# Patient Record
Sex: Female | Born: 1985 | Race: Black or African American | Hispanic: No | Marital: Single | Smoking: Current every day smoker
Health system: Southern US, Community
[De-identification: ages and names within clinical notes are randomized; demographics above are authoritative.]

## PROBLEM LIST (undated history)

## (undated) DIAGNOSIS — I10 Essential (primary) hypertension: Secondary | ICD-10-CM

## (undated) DIAGNOSIS — O24419 Gestational diabetes mellitus in pregnancy, unspecified control: Secondary | ICD-10-CM

---

## 2005-10-27 ENCOUNTER — Ambulatory Visit: Payer: Self-pay | Admitting: Family Medicine

## 2005-10-28 ENCOUNTER — Ambulatory Visit (HOSPITAL_COMMUNITY): Admission: RE | Admit: 2005-10-28 | Discharge: 2005-10-28 | Payer: Self-pay | Admitting: Family Medicine

## 2005-11-03 ENCOUNTER — Ambulatory Visit: Payer: Self-pay | Admitting: Family Medicine

## 2005-11-13 ENCOUNTER — Ambulatory Visit: Payer: Self-pay | Admitting: Gynecology

## 2005-11-27 ENCOUNTER — Ambulatory Visit: Payer: Self-pay | Admitting: Family Medicine

## 2005-12-04 ENCOUNTER — Ambulatory Visit: Payer: Self-pay | Admitting: Gynecology

## 2005-12-05 ENCOUNTER — Ambulatory Visit: Payer: Self-pay | Admitting: Obstetrics & Gynecology

## 2005-12-05 ENCOUNTER — Ambulatory Visit: Payer: Self-pay | Admitting: Family Medicine

## 2005-12-05 ENCOUNTER — Inpatient Hospital Stay (HOSPITAL_COMMUNITY): Admission: AD | Admit: 2005-12-05 | Discharge: 2005-12-13 | Payer: Self-pay | Admitting: Obstetrics & Gynecology

## 2005-12-18 ENCOUNTER — Ambulatory Visit: Payer: Self-pay | Admitting: Family Medicine

## 2005-12-23 ENCOUNTER — Inpatient Hospital Stay (HOSPITAL_COMMUNITY): Admission: AD | Admit: 2005-12-23 | Discharge: 2005-12-23 | Payer: Self-pay | Admitting: Obstetrics and Gynecology

## 2010-07-07 ENCOUNTER — Encounter: Payer: Self-pay | Admitting: *Deleted

## 2012-02-24 ENCOUNTER — Encounter (HOSPITAL_BASED_OUTPATIENT_CLINIC_OR_DEPARTMENT_OTHER): Payer: Self-pay

## 2012-02-24 ENCOUNTER — Emergency Department (HOSPITAL_BASED_OUTPATIENT_CLINIC_OR_DEPARTMENT_OTHER)
Admission: EM | Admit: 2012-02-24 | Discharge: 2012-02-24 | Disposition: A | Discharge status: Home / Self Care | Payer: Self-pay | Attending: Emergency Medicine | Admitting: Emergency Medicine

## 2012-02-24 DIAGNOSIS — N920 Excessive and frequent menstruation with regular cycle: Secondary | ICD-10-CM | POA: Insufficient documentation

## 2012-02-24 DIAGNOSIS — F17200 Nicotine dependence, unspecified, uncomplicated: Secondary | ICD-10-CM | POA: Insufficient documentation

## 2012-02-24 LAB — URINE MICROSCOPIC-ADD ON

## 2012-02-24 LAB — URINALYSIS, ROUTINE W REFLEX MICROSCOPIC
Bilirubin Urine: NEGATIVE
Specific Gravity, Urine: 1.018 (ref 1.005–1.030)
Urobilinogen, UA: 0.2 mg/dL (ref 0.0–1.0)

## 2012-02-24 NOTE — ED Provider Notes (Signed)
History     CSN: [id number]  Arrival date & time 02/24/12  2148   First MD Initiated Contact with Patient 02/24/12 2223      Chief Complaint  Patient presents with  . Vaginal Bleeding    (Consider location/radiation/quality/duration/timing/severity/associated sxs/prior treatment) HPI Pt reports she is approx 10 days late for menses. She woke up this morning with abdominal cramping and back pain. She passed two large clots from her vagina during the day today. She has had minimal continued bleeding since about 2pm. She took a home pregnancy test which is neg. Cramping has improved too.   History reviewed. No pertinent past medical history.  Past Surgical History  Procedure Date  . Cesarean section     No family history on file.  History  Substance Use Topics  . Smoking status: Current Everyday Smoker  . Smokeless tobacco: Not on file  . Alcohol Use: Yes    OB History    Grav Para Term Preterm Abortions TAB SAB Ect Mult Living                  Review of Systems All other systems reviewed and are negative except as noted in HPI.   Allergies  Review of patient's allergies indicates no known allergies.  Home Medications   Current Outpatient Rx  Name Route Sig Dispense Refill  . IBUPROFEN 200 MG PO TABS Oral Take 400 mg by mouth every 6 (six) hours as needed. For sleep    . IRON CR PO Oral Take 1 tablet by mouth daily.      BP 144/84  Pulse 80  Temp 98.1 F (36.7 C) (Oral)  Resp 16  Ht 5\' 4"  (1.626 m)  Wt 290 lb (131.543 kg)  BMI 49.78 kg/m2  SpO2 100%  LMP 01/11/2012  Physical Exam  Nursing note and vitals reviewed. Constitutional: She is oriented to person, place, and time. She appears well-developed and well-nourished.  HENT:  Head: Normocephalic and atraumatic.  Eyes: EOM are normal. Pupils are equal, round, and reactive to light.  Neck: Normal range of motion. Neck supple.  Cardiovascular: Normal rate, normal heart sounds and intact distal  pulses.   Pulmonary/Chest: Effort normal and breath sounds normal.  Abdominal: Bowel sounds are normal. She exhibits no distension. There is no tenderness.  Genitourinary: Cervix exhibits no discharge. There is bleeding around the vagina. No vaginal discharge found.  Musculoskeletal: Normal range of motion. She exhibits no edema and no tenderness.  Neurological: She is alert and oriented to person, place, and time. She has normal strength. No cranial nerve deficit or sensory deficit.  Skin: Skin is warm and dry. No rash noted.  Psychiatric: She has a normal mood and affect.    ED Course  Procedures (including critical care time)  Labs Reviewed  URINALYSIS, ROUTINE W REFLEX MICROSCOPIC - Abnormal; Notable for the following:    APPearance TURBID (*)     Hgb urine dipstick LARGE (*)     Nitrite POSITIVE (*)     Leukocytes, UA SMALL (*)     All other components within normal limits  URINE MICROSCOPIC-ADD ON - Abnormal; Notable for the following:    Squamous Epithelial / LPF FEW (*)     Bacteria, UA MANY (*)     All other components within normal limits  WET PREP, GENITAL - Abnormal; Notable for the following:    Clue Cells Wet Prep HPF POC MODERATE (*)     WBC, Wet Prep HPF POC  RARE (*)     All other components within normal limits  PREGNANCY, URINE  GC/CHLAMYDIA PROBE AMP, GENITAL   No results found.   No diagnosis found.    MDM  Preg neg, suspect menorrhagia due to late menses. Swabs sent but doubt vaginal infection. Advised to return for worsening bleeding or pain.         Chetara Kropp B. Bernette Mayers, MD 02/24/12 2310

## 2012-02-24 NOTE — ED Notes (Signed)
C/o lower back pain and heavy vaginal bleeding with blood clots-started this am-LMP 7/28

## 2012-02-25 LAB — GC/CHLAMYDIA PROBE AMP, GENITAL
Chlamydia, DNA Probe: NEGATIVE
GC Probe Amp, Genital: NEGATIVE

## 2013-09-27 ENCOUNTER — Encounter (HOSPITAL_COMMUNITY): Payer: Self-pay | Admitting: Obstetrics and Gynecology

## 2013-09-28 ENCOUNTER — Other Ambulatory Visit (HOSPITAL_COMMUNITY): Payer: Self-pay | Admitting: Obstetrics and Gynecology

## 2013-09-28 DIAGNOSIS — Z3689 Encounter for other specified antenatal screening: Secondary | ICD-10-CM

## 2013-09-28 DIAGNOSIS — O09299 Supervision of pregnancy with other poor reproductive or obstetric history, unspecified trimester: Secondary | ICD-10-CM

## 2013-10-05 ENCOUNTER — Ambulatory Visit (HOSPITAL_COMMUNITY)
Admission: RE | Admit: 2013-10-05 | Discharge: 2013-10-05 | Disposition: A | Discharge status: Home / Self Care | Payer: Self-pay | Source: Ambulatory Visit | Attending: Obstetrics and Gynecology | Admitting: Obstetrics and Gynecology

## 2013-10-07 ENCOUNTER — Encounter (HOSPITAL_COMMUNITY): Payer: Self-pay

## 2013-10-07 ENCOUNTER — Ambulatory Visit (HOSPITAL_COMMUNITY)
Admission: RE | Admit: 2013-10-07 | Discharge: 2013-10-07 | Disposition: A | Discharge status: Home / Self Care | Payer: Medicaid Other | Source: Ambulatory Visit | Attending: Obstetrics and Gynecology | Admitting: Obstetrics and Gynecology

## 2013-10-07 DIAGNOSIS — Z3689 Encounter for other specified antenatal screening: Secondary | ICD-10-CM

## 2013-10-07 DIAGNOSIS — O09299 Supervision of pregnancy with other poor reproductive or obstetric history, unspecified trimester: Secondary | ICD-10-CM | POA: Insufficient documentation

## 2013-10-10 ENCOUNTER — Other Ambulatory Visit (HOSPITAL_COMMUNITY): Payer: Self-pay | Admitting: Obstetrics and Gynecology

## 2013-10-10 DIAGNOSIS — O358XX0 Maternal care for other (suspected) fetal abnormality and damage, not applicable or unspecified: Secondary | ICD-10-CM

## 2013-10-10 DIAGNOSIS — O99210 Obesity complicating pregnancy, unspecified trimester: Principal | ICD-10-CM

## 2013-10-10 DIAGNOSIS — E669 Obesity, unspecified: Secondary | ICD-10-CM

## 2013-11-18 ENCOUNTER — Ambulatory Visit (HOSPITAL_COMMUNITY): Payer: Medicaid Other | Attending: Obstetrics and Gynecology

## 2013-11-28 ENCOUNTER — Ambulatory Visit (HOSPITAL_COMMUNITY): Payer: Medicaid Other

## 2013-12-05 ENCOUNTER — Ambulatory Visit (HOSPITAL_COMMUNITY): Payer: Medicaid Other

## 2013-12-09 ENCOUNTER — Encounter (HOSPITAL_COMMUNITY): Payer: Self-pay

## 2013-12-09 ENCOUNTER — Ambulatory Visit (HOSPITAL_COMMUNITY)
Admission: RE | Admit: 2013-12-09 | Discharge: 2013-12-09 | Disposition: A | Discharge status: Home / Self Care | Payer: Medicaid Other | Source: Ambulatory Visit | Attending: Obstetrics and Gynecology | Admitting: Obstetrics and Gynecology

## 2013-12-09 ENCOUNTER — Other Ambulatory Visit (HOSPITAL_COMMUNITY): Payer: Self-pay | Admitting: Obstetrics and Gynecology

## 2013-12-09 DIAGNOSIS — O358XX0 Maternal care for other (suspected) fetal abnormality and damage, not applicable or unspecified: Secondary | ICD-10-CM | POA: Diagnosis present

## 2013-12-09 DIAGNOSIS — O99210 Obesity complicating pregnancy, unspecified trimester: Secondary | ICD-10-CM

## 2013-12-09 DIAGNOSIS — E669 Obesity, unspecified: Secondary | ICD-10-CM

## 2013-12-09 DIAGNOSIS — O99810 Abnormal glucose complicating pregnancy: Secondary | ICD-10-CM

## 2013-12-09 DIAGNOSIS — Z3689 Encounter for other specified antenatal screening: Secondary | ICD-10-CM | POA: Insufficient documentation

## 2013-12-09 DIAGNOSIS — O365991 Maternal care for other known or suspected poor fetal growth, unspecified trimester, fetus 1: Secondary | ICD-10-CM

## 2013-12-15 ENCOUNTER — Other Ambulatory Visit (HOSPITAL_COMMUNITY): Payer: Self-pay | Admitting: Obstetrics and Gynecology

## 2013-12-15 ENCOUNTER — Encounter (HOSPITAL_COMMUNITY): Payer: Self-pay

## 2013-12-15 ENCOUNTER — Ambulatory Visit (HOSPITAL_COMMUNITY)
Admission: RE | Admit: 2013-12-15 | Discharge: 2013-12-15 | Disposition: A | Discharge status: Home / Self Care | Payer: Medicaid Other | Source: Ambulatory Visit | Attending: Obstetrics and Gynecology | Admitting: Obstetrics and Gynecology

## 2013-12-15 DIAGNOSIS — O99210 Obesity complicating pregnancy, unspecified trimester: Principal | ICD-10-CM

## 2013-12-15 DIAGNOSIS — O30009 Twin pregnancy, unspecified number of placenta and unspecified number of amniotic sacs, unspecified trimester: Secondary | ICD-10-CM | POA: Diagnosis not present

## 2013-12-15 DIAGNOSIS — O99810 Abnormal glucose complicating pregnancy: Secondary | ICD-10-CM | POA: Insufficient documentation

## 2013-12-15 DIAGNOSIS — O365990 Maternal care for other known or suspected poor fetal growth, unspecified trimester, not applicable or unspecified: Secondary | ICD-10-CM | POA: Insufficient documentation

## 2013-12-15 DIAGNOSIS — Z3689 Encounter for other specified antenatal screening: Secondary | ICD-10-CM | POA: Insufficient documentation

## 2013-12-15 DIAGNOSIS — E669 Obesity, unspecified: Secondary | ICD-10-CM | POA: Insufficient documentation

## 2013-12-15 DIAGNOSIS — O365991 Maternal care for other known or suspected poor fetal growth, unspecified trimester, fetus 1: Secondary | ICD-10-CM

## 2013-12-15 HISTORY — DX: Gestational diabetes mellitus in pregnancy, unspecified control: O24.419

## 2013-12-19 ENCOUNTER — Other Ambulatory Visit: Payer: Self-pay

## 2013-12-23 ENCOUNTER — Ambulatory Visit (HOSPITAL_COMMUNITY): Admission: RE | Admit: 2013-12-23 | Payer: Medicaid Other | Source: Ambulatory Visit

## 2013-12-30 ENCOUNTER — Ambulatory Visit (HOSPITAL_COMMUNITY): Payer: Medicaid Other | Attending: Obstetrics and Gynecology

## 2013-12-30 ENCOUNTER — Ambulatory Visit (HOSPITAL_COMMUNITY): Payer: Medicaid Other

## 2014-01-06 ENCOUNTER — Ambulatory Visit (HOSPITAL_COMMUNITY): Payer: Medicaid Other

## 2014-04-17 ENCOUNTER — Encounter (HOSPITAL_COMMUNITY): Payer: Self-pay

## 2014-08-12 ENCOUNTER — Encounter (HOSPITAL_COMMUNITY): Payer: Self-pay | Admitting: *Deleted

## 2014-12-28 IMAGING — US US OB DETAIL+14 WK
1 series · 12 of 28 positions shown · non-contrast
Comparison: none

[Series 1: us ob detail+14 wk · 0.30mm/px · 12 of 65 slices shown]
[im 3/65]
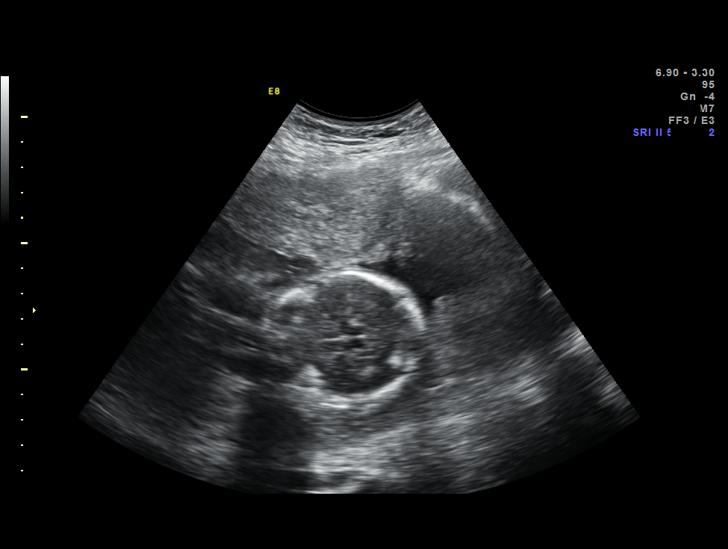
[im 8/65]
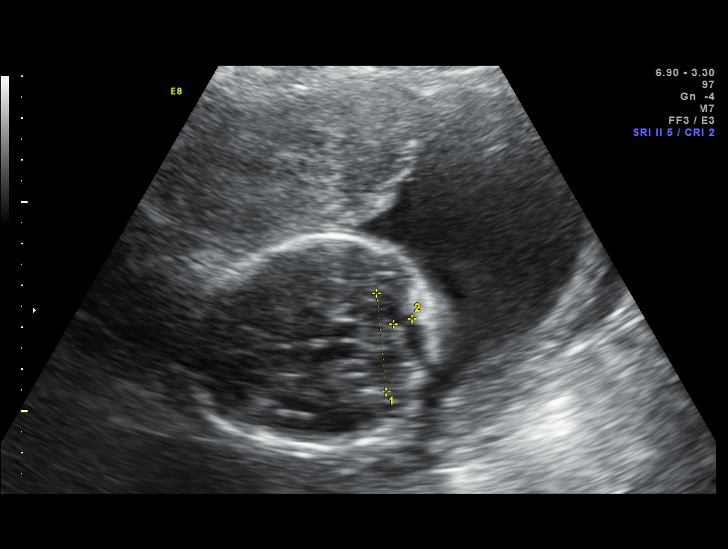
[im 12/65]
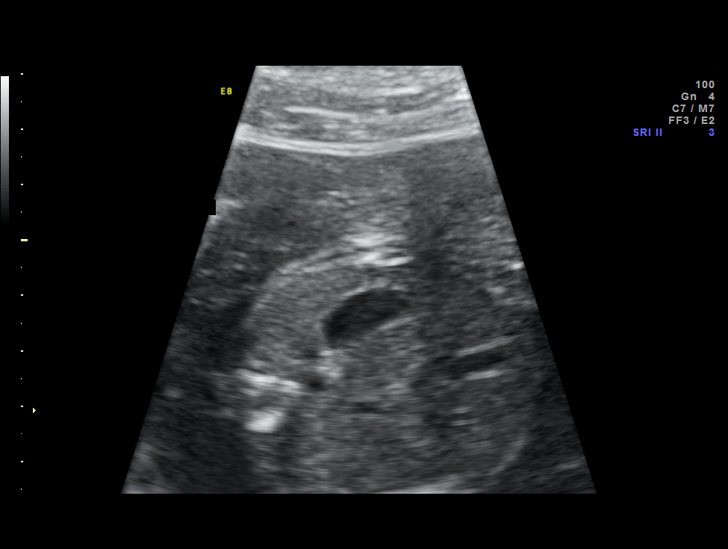
[im 19/65]
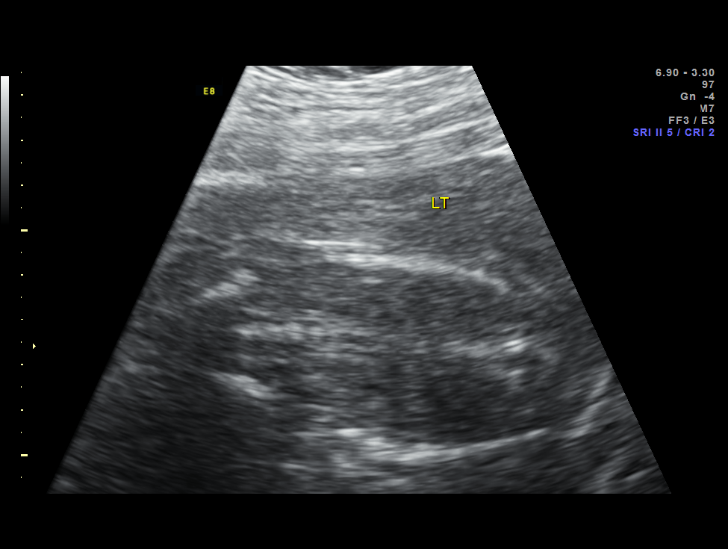
[im 24/65]
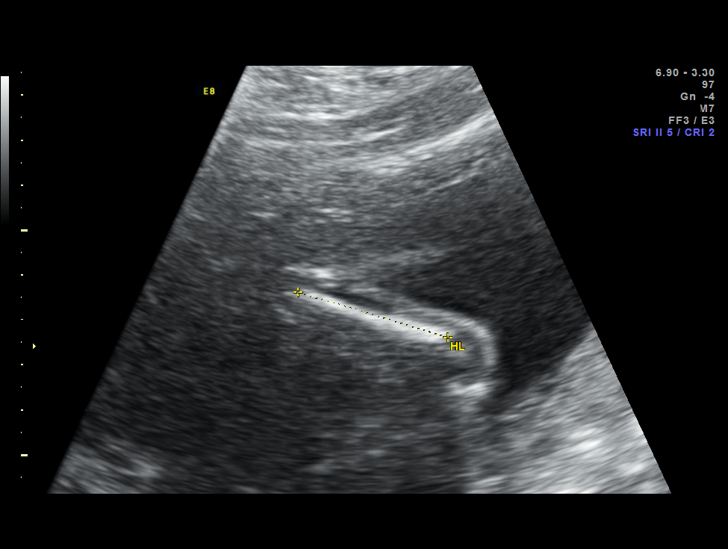
[im 29/65]
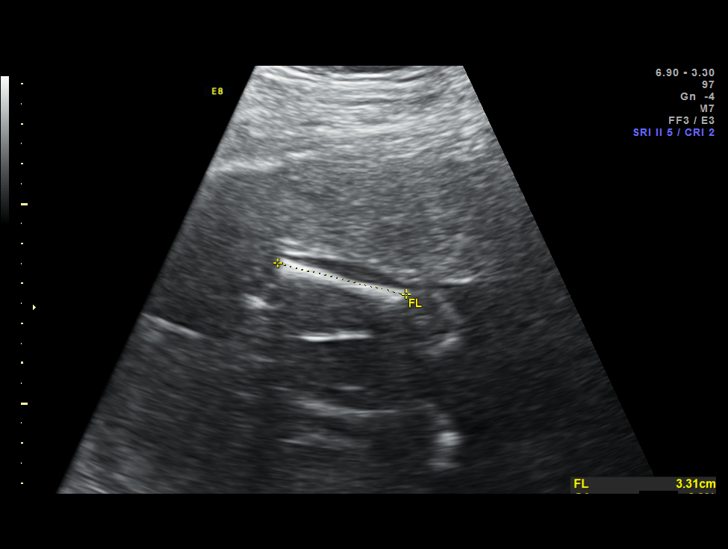
[im 36/65]
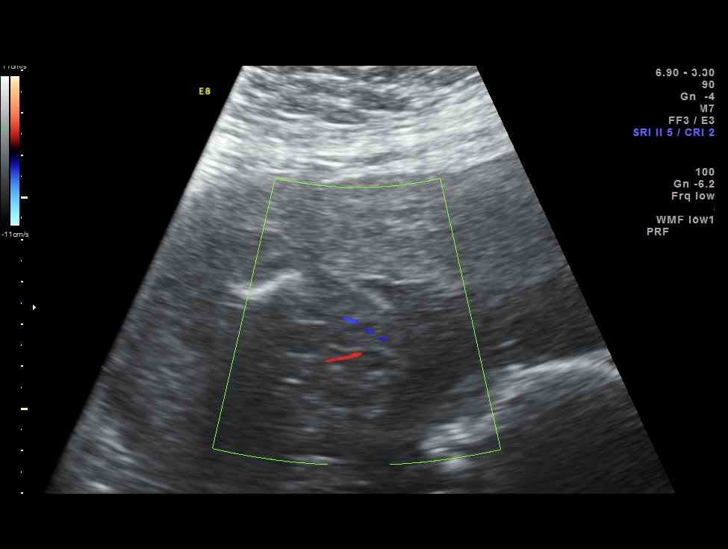
[im 41/65]
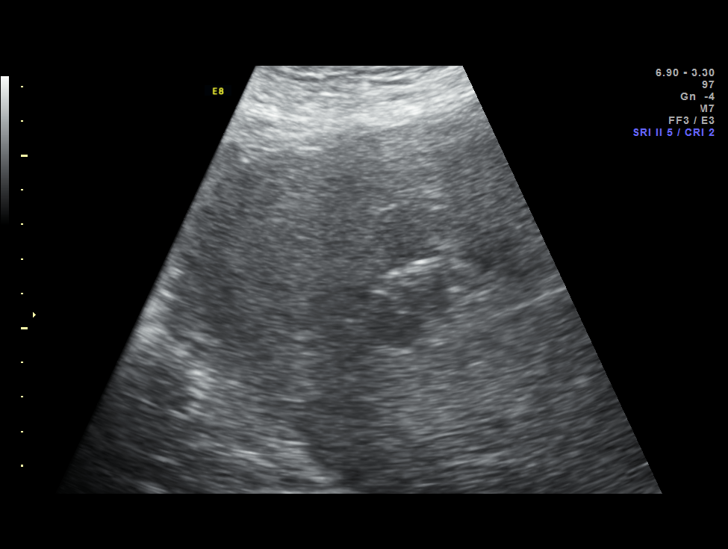
[im 46/65]
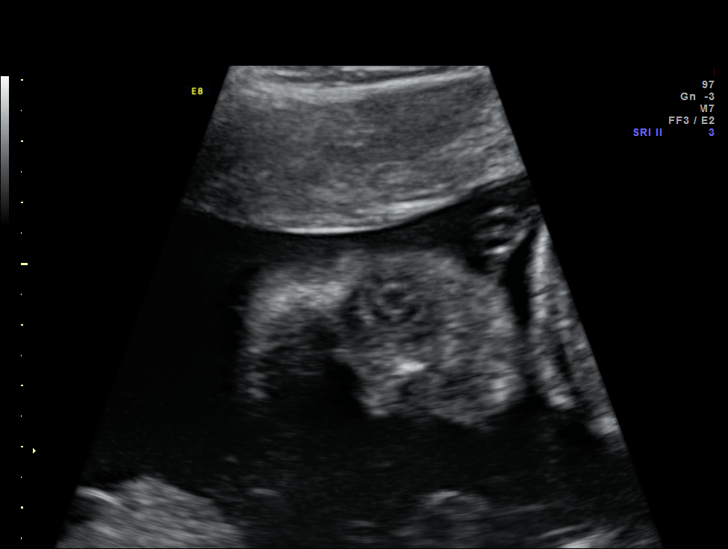
[im 53/65]
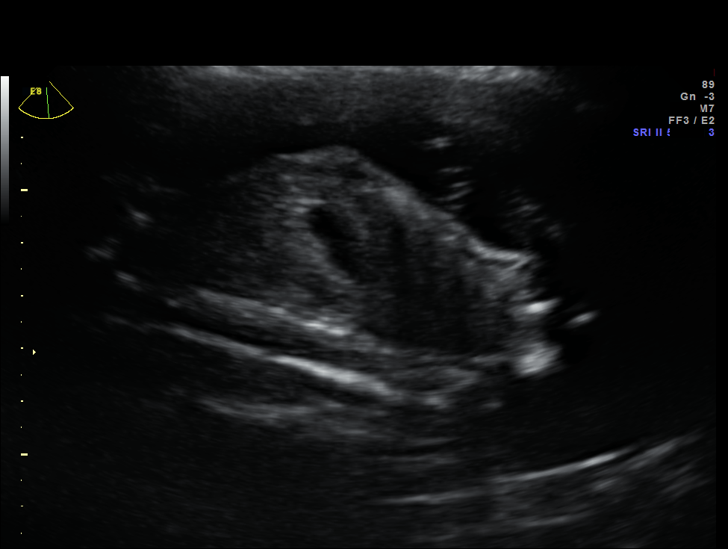
[im 57/65]
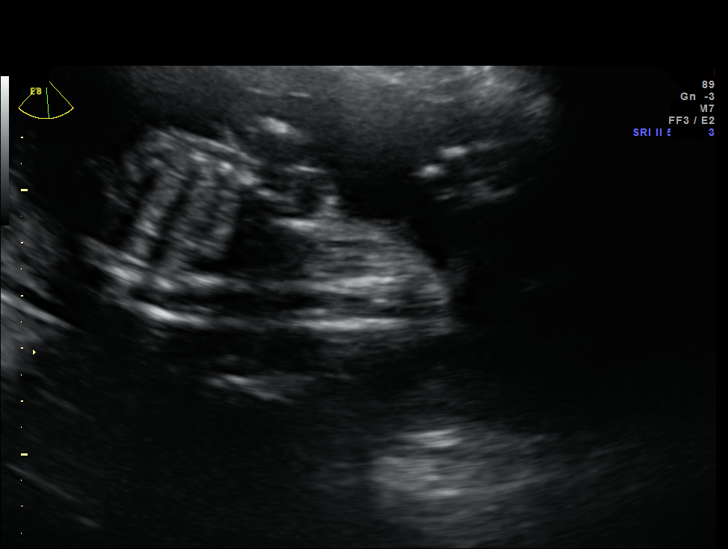
[im 62/65]
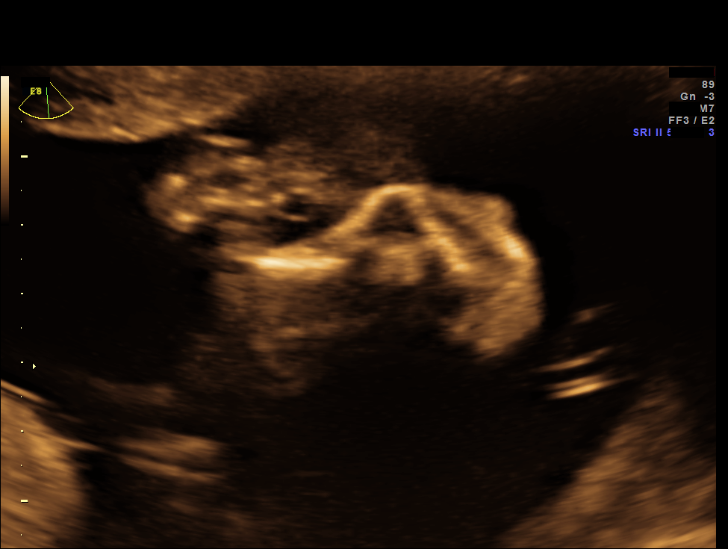

[12 of 28 positions shown; findings below may reference images not displayed]

OBSTETRICS REPORT
                      (Signed Final 10/07/2013 [DATE])

Service(s) Provided

 US OB DETAIL + 14 WK                                  76811.0
Indications

 Detailed fetal anatomic survey
 Maternal morbid obesity
Fetal Evaluation

 Num Of Fetuses:    1
 Fetal Heart Rate:  155                          bpm
 Cardiac Activity:  Observed
 Presentation:      Cephalic
 Placenta:          Fundal, above cervical os
 P. Cord            Not well visualized
 Insertion:

 Amniotic Fluid
 AFI FV:      Subjectively within normal limits
                                             Larg Pckt:     3.9  cm
Biometry

 BPD:     50.7  mm     G. Age:  21w 3d                CI:         72.4   70 - 86
 OFD:       70  mm                                    FL/HC:      17.2   19.2 -

 HC:     197.3  mm     G. Age:  22w 0d       15  %    HC/AC:      1.20   1.05 -

 AC:     163.9  mm     G. Age:  21w 3d       13  %    FL/BPD:     66.9   71 - 87
 FL:      33.9  mm     G. Age:  20w 5d      < 3  %    FL/AC:      20.7   20 - 24
 HUM:     35.6  mm     G. Age:  22w 3d       39  %

 Est. FW:     404  gm    0 lb 14 oz      19  %
Gestational Age

 LMP:           22w 4d        Date:  05/02/13                 EDD:   02/06/14
 U/S Today:     21w 3d                                        EDD:   02/14/14
 Best:          22w 4d     Det. By:  LMP  (05/02/13)          EDD:   02/06/14
Anatomy

 Cranium:          Appears normal         Aortic Arch:      Not well visualized
 Fetal Cavum:      Not well visualized    Ductal Arch:      Not well visualized
 Ventricles:       Not well visualized    Diaphragm:        Not well visualized
 Choroid Plexus:   Not well visualized    Stomach:          Appears normal, left
                                                            sided
 Cerebellum:       Appears normal         Abdomen:          Appears normal
 Posterior Fossa:  Appears normal         Abdominal Wall:   Appears nml (cord
                                                            insert, abd wall)
 Nuchal Fold:      Not applicable (>20    Cord Vessels:     Appears normal (3
                   wks GA)                                  vessel cord)
 Face:             Appears normal         Kidneys:          Appear normal
                   (orbits and profile)
 Lips:             Appears normal         Bladder:          Appears normal
 Heart:            Appears normal         Spine:            Not well visualized
                   (4CH, axis, and
                   situs)
 RVOT:             Appears normal         Lower             Visualized
                                          Extremities:
 LVOT:             Appears normal         Upper             Visualized
                                          Extremities:

 Other:  Technically difficult due to  maternal habitus.
Cervix Uterus Adnexa

 Cervical Length:    5        cm

 Cervix:       Normal appearance by transabdominal scan.
 Uterus:       No abnormality visualized.
 Cul De Sac:   No free fluid seen.
 Left Ovary:    Not visualized.
 Right Ovary:   Not visualized.
Impression

 SIUP at 22+4 weeks
 Normal detailed fetal anatomy; limited views of intracranial
 anatomy; arches and spine
 Normal amniotic fluid volume
 Measurements consistent with LMP dating
Recommendations

 Follow-up ultrasound in 6 weeks to complete anatomy survey
 and reassess growth

 questions or concerns.

## 2023-07-12 ENCOUNTER — Encounter (HOSPITAL_BASED_OUTPATIENT_CLINIC_OR_DEPARTMENT_OTHER): Payer: Self-pay | Admitting: *Deleted

## 2023-07-12 ENCOUNTER — Emergency Department (HOSPITAL_BASED_OUTPATIENT_CLINIC_OR_DEPARTMENT_OTHER): Payer: Medicaid Other

## 2023-07-12 ENCOUNTER — Emergency Department (HOSPITAL_BASED_OUTPATIENT_CLINIC_OR_DEPARTMENT_OTHER)
Admission: EM | Admit: 2023-07-12 | Discharge: 2023-07-12 | Disposition: A | Discharge status: Home / Self Care | Payer: Medicaid Other | Attending: Emergency Medicine | Admitting: Emergency Medicine

## 2023-07-12 ENCOUNTER — Other Ambulatory Visit: Payer: Self-pay

## 2023-07-12 DIAGNOSIS — Z794 Long term (current) use of insulin: Secondary | ICD-10-CM | POA: Insufficient documentation

## 2023-07-12 DIAGNOSIS — D72829 Elevated white blood cell count, unspecified: Secondary | ICD-10-CM | POA: Insufficient documentation

## 2023-07-12 DIAGNOSIS — R112 Nausea with vomiting, unspecified: Secondary | ICD-10-CM | POA: Insufficient documentation

## 2023-07-12 DIAGNOSIS — N3001 Acute cystitis with hematuria: Secondary | ICD-10-CM | POA: Insufficient documentation

## 2023-07-12 DIAGNOSIS — E119 Type 2 diabetes mellitus without complications: Secondary | ICD-10-CM | POA: Diagnosis not present

## 2023-07-12 DIAGNOSIS — E871 Hypo-osmolality and hyponatremia: Secondary | ICD-10-CM | POA: Diagnosis not present

## 2023-07-12 DIAGNOSIS — D649 Anemia, unspecified: Secondary | ICD-10-CM | POA: Insufficient documentation

## 2023-07-12 DIAGNOSIS — R1032 Left lower quadrant pain: Secondary | ICD-10-CM | POA: Diagnosis present

## 2023-07-12 LAB — CBC WITH DIFFERENTIAL/PLATELET
Abs Immature Granulocytes: 0.1 10*3/uL — ABNORMAL HIGH (ref 0.00–0.07)
Basophils Absolute: 0 10*3/uL (ref 0.0–0.1)
Basophils Relative: 0 %
Eosinophils Absolute: 0 10*3/uL (ref 0.0–0.5)
Eosinophils Relative: 0 %
HCT: 33.6 % — ABNORMAL LOW (ref 36.0–46.0)
Hemoglobin: 10.6 g/dL — ABNORMAL LOW (ref 12.0–15.0)
Immature Granulocytes: 1 %
Lymphocytes Relative: 10 %
Lymphs Abs: 1.5 10*3/uL (ref 0.7–4.0)
MCH: 22.7 pg — ABNORMAL LOW (ref 26.0–34.0)
MCHC: 31.5 g/dL (ref 30.0–36.0)
MCV: 72.1 fL — ABNORMAL LOW (ref 80.0–100.0)
Monocytes Absolute: 0.4 10*3/uL (ref 0.1–1.0)
Monocytes Relative: 3 %
Neutro Abs: 12.6 10*3/uL — ABNORMAL HIGH (ref 1.7–7.7)
Neutrophils Relative %: 86 %
Platelets: 382 10*3/uL (ref 150–400)
RBC: 4.66 MIL/uL (ref 3.87–5.11)
RDW: 18.4 % — ABNORMAL HIGH (ref 11.5–15.5)
WBC: 14.7 10*3/uL — ABNORMAL HIGH (ref 4.0–10.5)
nRBC: 0 % (ref 0.0–0.2)

## 2023-07-12 LAB — URINALYSIS, MICROSCOPIC (REFLEX)
RBC / HPF: >50 RBC/hpf (ref 0–5)
WBC, UA: >50 WBC/hpf (ref 0–5)

## 2023-07-12 LAB — URINALYSIS, ROUTINE W REFLEX MICROSCOPIC
Bilirubin Urine: NEGATIVE
Glucose, UA: 100 mg/dL — AB
Ketones, ur: NEGATIVE mg/dL
Nitrite: POSITIVE — AB
Protein, ur: >=300 mg/dL — AB
Specific Gravity, Urine: 1.025 (ref 1.005–1.030)
pH: 6.5 (ref 5.0–8.0)

## 2023-07-12 LAB — BASIC METABOLIC PANEL
Anion gap: 11 (ref 5–15)
BUN: 9 mg/dL (ref 6–20)
CO2: 22 mmol/L (ref 22–32)
Calcium: 8.8 mg/dL — ABNORMAL LOW (ref 8.9–10.3)
Chloride: 98 mmol/L (ref 98–111)
Creatinine, Ser: 0.67 mg/dL (ref 0.44–1.00)
GFR, Estimated: >60 mL/min (ref >60)
Glucose, Bld: 286 mg/dL — ABNORMAL HIGH (ref 70–99)
Potassium: 3.6 mmol/L (ref 3.5–5.1)
Sodium: 131 mmol/L — ABNORMAL LOW (ref 135–145)

## 2023-07-12 LAB — PREGNANCY, URINE: Preg Test, Ur: NEGATIVE

## 2023-07-12 MED ORDER — CEPHALEXIN 500 MG PO CAPS: 500.0000 mg | ORAL_CAPSULE | Freq: Four times a day (QID) | ORAL | 0 refills | Status: DC

## 2023-07-12 MED ORDER — NAPROXEN 500 MG PO TABS
500.0000 mg | ORAL_TABLET | Freq: Two times a day (BID) | ORAL | 0 refills | Status: DC
Start: 2023-07-12 — End: 2024-01-05

## 2023-07-12 MED ORDER — SODIUM CHLORIDE 0.9 % IV SOLN
1.0000 g | Freq: Once | INTRAVENOUS | Status: AC
  Administered 2023-07-12: 1 g via INTRAVENOUS
  Filled 2023-07-12: qty 10

## 2023-07-12 MED ORDER — ONDANSETRON 4 MG PO TBDP
4.0000 mg | ORAL_TABLET | Freq: Once | ORAL | Status: AC
  Administered 2023-07-12: 4 mg via ORAL
  Filled 2023-07-12: qty 1

## 2023-07-12 MED ORDER — KETOROLAC TROMETHAMINE 30 MG/ML IJ SOLN
30.0000 mg | Freq: Once | INTRAMUSCULAR | Status: AC
  Administered 2023-07-12: 30 mg via INTRAVENOUS
  Filled 2023-07-12: qty 1

## 2023-07-12 MED ORDER — HALOPERIDOL LACTATE 5 MG/ML IJ SOLN
5.0000 mg | Freq: Once | INTRAMUSCULAR | Status: AC
  Administered 2023-07-12: 5 mg via INTRAVENOUS
  Filled 2023-07-12: qty 1

## 2023-07-12 MED ORDER — ONDANSETRON 8 MG PO TBDP: ORAL_TABLET | ORAL | 0 refills | Status: DC

## 2023-07-12 NOTE — ED Provider Notes (Signed)
[place] [facility] Provider Note   CSN: [id number] Arrival date & time: 07/12/23  0028     History  Chief Complaint  Patient presents with   Abdominal Pain    Heather Deleon is a 38 y.o. female.  The history is provided by the patient.  Abdominal Pain Pain location:  Suprapubic Pain quality: pressure   Pain radiates to:  LLQ Pain severity:  Moderate Onset quality:  Gradual Duration:  3 hours Timing:  Constant Progression:  Unchanged Chronicity:  New Context: not trauma   Relieved by:  Nothing Worsened by:  Nothing Ineffective treatments:  None tried Associated symptoms: dysuria, nausea and vomiting   Associated symptoms: no chest pain, no fever and no hematuria   Risk factors: not pregnant        Home Medications Prior to Admission medications   Medication Sig Start Date End Date Taking? Authorizing Provider  cephALEXin (KEFLEX) 500 MG capsule Take 1 capsule (500 mg total) by mouth 4 (four) times daily. 07/12/23  Yes Darrian Goodwill, MD  naproxen (NAPROSYN) 500 MG tablet Take 1 tablet (500 mg total) by mouth 2 (two) times daily. 07/12/23  Yes Ether Goebel, MD  ondansetron (ZOFRAN-ODT) 8 MG disintegrating tablet 8mg  ODT q8 hours prn nausea 07/12/23  Yes Franceska Strahm, MD  amoxicillin (AMOXIL) 500 MG capsule Take 500 mg by mouth 2 (two) times daily with a meal.    [provider]  ibuprofen (ADVIL,MOTRIN) 200 MG tablet Take 400 mg by mouth every 6 (six) hours as needed. For sleep    [provider]  insulin NPH Human (HUMULIN N,NOVOLIN N) 100 UNIT/ML injection Inject 20 Units into the skin at bedtime.    [provider]  IRON CR PO Take 1 tablet by mouth daily.    [provider]      Allergies    Percocet [oxycodone-acetaminophen]    Review of Systems   Review of Systems  Constitutional:  Negative for fever.  HENT:  Negative for facial swelling.   Respiratory:  Negative for wheezing and  stridor.   Cardiovascular:  Negative for chest pain.  Gastrointestinal:  Positive for abdominal pain, nausea and vomiting.  Genitourinary:  Positive for dysuria. Negative for flank pain and hematuria.  All other systems reviewed and are negative.   Physical Exam Updated Vital Signs BP (!) 145/79 (BP Location: Left Arm)   Pulse 67   Temp 98.3 F (36.8 C)   Resp 18   LMP 07/05/2023   SpO2 95%  Physical Exam Vitals and nursing note reviewed.  Constitutional:      General: She is not in acute distress.    Appearance: She is well-developed.  HENT:     Head: Normocephalic and atraumatic.  Eyes:     Pupils: Pupils are equal, round, and reactive to light.  Cardiovascular:     Rate and Rhythm: Normal rate and regular rhythm.     Pulses: Normal pulses.     Heart sounds: Normal heart sounds.  Pulmonary:     Effort: Pulmonary effort is normal. No respiratory distress.     Breath sounds: Normal breath sounds.  Abdominal:     General: Bowel sounds are normal. There is no distension.     Palpations: Abdomen is soft.     Tenderness: There is no abdominal tenderness. There is no guarding or rebound.  Genitourinary:    Vagina: No vaginal discharge.  Musculoskeletal:        General:  Normal range of motion.     Cervical back: Neck supple.  Skin:    General: Skin is dry.     Capillary Refill: Capillary refill takes less than 2 seconds.     Findings: No erythema or rash.  Neurological:     General: No focal deficit present.     Deep Tendon Reflexes: Reflexes normal.  Psychiatric:        Mood and Affect: Mood normal.     ED Results / Procedures / Treatments   Labs (all labs ordered are listed, but only abnormal results are displayed) Results for orders placed or performed during the hospital encounter of 07/12/23  Urinalysis, Routine w reflex microscopic -Urine, Clean Catch   Collection Time: 07/12/23 12:42 AM  Result Value Ref Range   Color, Urine YELLOW YELLOW   APPearance  TURBID (A) CLEAR   Specific Gravity, Urine 1.025 1.005 - 1.030   pH 6.5 5.0 - 8.0   Glucose, UA 100 (A) NEGATIVE mg/dL   Hgb urine dipstick LARGE (A) NEGATIVE   Bilirubin Urine NEGATIVE NEGATIVE   Ketones, ur NEGATIVE NEGATIVE mg/dL   Protein, ur >=098 (A) NEGATIVE mg/dL   Nitrite POSITIVE (A) NEGATIVE   Leukocytes,Ua SMALL (A) NEGATIVE  Urinalysis, Microscopic (reflex)   Collection Time: 07/12/23 12:42 AM  Result Value Ref Range   RBC / HPF >50 0 - 5 RBC/hpf   WBC, UA >50 0 - 5 WBC/hpf   Bacteria, UA MANY (A) NONE SEEN   Squamous Epithelial / HPF 0-5 0 - 5 /HPF   WBC Clumps PRESENT    Mucus PRESENT   Pregnancy, urine   Collection Time: 07/12/23 12:43 AM  Result Value Ref Range   Preg Test, Ur NEGATIVE NEGATIVE  CBC with Differential   Collection Time: 07/12/23  1:56 AM  Result Value Ref Range   WBC 14.7 (H) 4.0 - 10.5 K/uL   RBC 4.66 3.87 - 5.11 MIL/uL   Hemoglobin 10.6 (L) 12.0 - 15.0 g/dL   HCT 11.9 (L) 14.7 - 82.9 %   MCV 72.1 (L) 80.0 - 100.0 fL   MCH 22.7 (L) 26.0 - 34.0 pg   MCHC 31.5 30.0 - 36.0 g/dL   RDW 56.2 (H) 13.0 - 86.5 %   Platelets 382 150 - 400 K/uL   nRBC 0.0 0.0 - 0.2 %   Neutrophils Relative % 86 %   Neutro Abs 12.6 (H) 1.7 - 7.7 K/uL   Lymphocytes Relative 10 %   Lymphs Abs 1.5 0.7 - 4.0 K/uL   Monocytes Relative 3 %   Monocytes Absolute 0.4 0.1 - 1.0 K/uL   Eosinophils Relative 0 %   Eosinophils Absolute 0.0 0.0 - 0.5 K/uL   Basophils Relative 0 %   Basophils Absolute 0.0 0.0 - 0.1 K/uL   Immature Granulocytes 1 %   Abs Immature Granulocytes 0.10 (H) 0.00 - 0.07 K/uL  Basic metabolic panel   Collection Time: 07/12/23  1:56 AM  Result Value Ref Range   Sodium 131 (L) 135 - 145 mmol/L   Potassium 3.6 3.5 - 5.1 mmol/L   Chloride 98 98 - 111 mmol/L   CO2 22 22 - 32 mmol/L   Glucose, Bld 286 (H) 70 - 99 mg/dL   BUN 9 6 - 20 mg/dL   Creatinine, Ser 7.84 0.44 - 1.00 mg/dL   Calcium 8.8 (L) 8.9 - 10.3 mg/dL   GFR, Estimated >69 >62 mL/min    Anion gap 11 5 - 15  CT Renal Stone Study Result Date: 07/12/2023 CLINICAL DATA:  Abdominal/flank pain, stone suspected EXAM: CT ABDOMEN AND PELVIS WITHOUT CONTRAST TECHNIQUE: Multidetector CT imaging of the abdomen and pelvis was performed following the standard protocol without IV contrast. RADIATION DOSE REDUCTION: This exam was performed according to the departmental dose-optimization program which includes automated exposure control, adjustment of the mA and/or kV according to patient size and/or use of iterative reconstruction technique. COMPARISON:  CT stone 08/14/2015 report without imaging FINDINGS: Lower chest: No acute abnormality. Hepatobiliary: Liver is enlarged measuring up to 25 cm. No focal liver abnormality. No gallstones, gallbladder wall thickening, or pericholecystic fluid. No biliary dilatation. Pancreas: No focal lesion. Normal pancreatic contour. No surrounding inflammatory changes. No main pancreatic ductal dilatation. Spleen: Normal in size without focal abnormality. Adrenals/Urinary Tract: No adrenal nodule bilaterally. Nonspecific slight haziness of the left collecting system. No nephrolithiasis and no hydronephrosis. No definite contour-deforming renal mass. No ureterolithiasis or hydroureter. The urinary bladder is unremarkable. Stomach/Bowel: Stomach is within normal limits. No evidence of bowel wall thickening or dilatation. Appendix appears normal. Vascular/Lymphatic: No abdominal aorta or iliac aneurysm. No abdominal, pelvic, or inguinal lymphadenopathy. Reproductive: Lobulated uterine contour suggestive of uterine fibroid (602:94). Bilateral adnexal regions are unremarkable. Other: No intraperitoneal free fluid. No intraperitoneal free gas. No organized fluid collection. Musculoskeletal: Diastasis rectus. Mild subcutaneus soft tissue edema of the anterior abdominal wall may be due to prior surgery. No abdominal wall hernia. No suspicious lytic or blastic osseous lesions. No  acute displaced fracture. IMPRESSION: 1. Nonspecific slight haziness of the left connecting system with limited evaluation on this noncontrast study. Correlate with urinalysis. 2. Uterine fibroid. 3. Hepatomegaly. Electronically Signed   By: Tish Frederickson M.D.   On: 07/12/2023 01:51    EKG None  Radiology CT Renal Stone Study Result Date: 07/12/2023 CLINICAL DATA:  Abdominal/flank pain, stone suspected EXAM: CT ABDOMEN AND PELVIS WITHOUT CONTRAST TECHNIQUE: Multidetector CT imaging of the abdomen and pelvis was performed following the standard protocol without IV contrast. RADIATION DOSE REDUCTION: This exam was performed according to the departmental dose-optimization program which includes automated exposure control, adjustment of the mA and/or kV according to patient size and/or use of iterative reconstruction technique. COMPARISON:  CT stone 08/14/2015 report without imaging FINDINGS: Lower chest: No acute abnormality. Hepatobiliary: Liver is enlarged measuring up to 25 cm. No focal liver abnormality. No gallstones, gallbladder wall thickening, or pericholecystic fluid. No biliary dilatation. Pancreas: No focal lesion. Normal pancreatic contour. No surrounding inflammatory changes. No main pancreatic ductal dilatation. Spleen: Normal in size without focal abnormality. Adrenals/Urinary Tract: No adrenal nodule bilaterally. Nonspecific slight haziness of the left collecting system. No nephrolithiasis and no hydronephrosis. No definite contour-deforming renal mass. No ureterolithiasis or hydroureter. The urinary bladder is unremarkable. Stomach/Bowel: Stomach is within normal limits. No evidence of bowel wall thickening or dilatation. Appendix appears normal. Vascular/Lymphatic: No abdominal aorta or iliac aneurysm. No abdominal, pelvic, or inguinal lymphadenopathy. Reproductive: Lobulated uterine contour suggestive of uterine fibroid (602:94). Bilateral adnexal regions are unremarkable. Other: No  intraperitoneal free fluid. No intraperitoneal free gas. No organized fluid collection. Musculoskeletal: Diastasis rectus. Mild subcutaneus soft tissue edema of the anterior abdominal wall may be due to prior surgery. No abdominal wall hernia. No suspicious lytic or blastic osseous lesions. No acute displaced fracture. IMPRESSION: 1. Nonspecific slight haziness of the left connecting system with limited evaluation on this noncontrast study. Correlate with urinalysis. 2. Uterine fibroid. 3. Hepatomegaly. Electronically Signed   By: Normajean Glasgow.D.  On: 07/12/2023 01:51    Procedures Procedures    Medications Ordered in ED Medications  ondansetron (ZOFRAN-ODT) disintegrating tablet 4 mg (4 mg Oral Given 07/12/23 0148)  ketorolac (TORADOL) 30 MG/ML injection 30 mg (30 mg Intravenous Given 07/12/23 0207)  haloperidol lactate (HALDOL) injection 5 mg (5 mg Intravenous Given 07/12/23 0207)  cefTRIAXone (ROCEPHIN) 1 g in sodium chloride 0.9 % 100 mL IVPB (0 g Intravenous Stopped 07/12/23 0321)    ED Course/ Medical Decision Making/ A&P                                 Medical Decision Making Patient with suprapubic pain that started radiated to LLQ with now emesis.    Amount and/or Complexity of Data Reviewed External Data Reviewed: notes.    Details: Previous notes reviewed  Labs: ordered.    Details: Pregnancy is negative urine is consistent with UTI. White count elevated 14.7, low hemoglobin 10.6, normal platelets.  Sodium slight low 131, normal potassium 3.6 normal creatinine  Radiology: ordered and independent interpretation performed.    Details: No stones by me on CT  Risk Prescription drug management. Risk Details: Well appearing. Emesis stopped in the ED.  Patient reports marked improvement in symptoms.  Will start antibiotics for UTI and nause medication.   Stable for discharge with close follow up.  Strict return     Final Clinical Impression(s) / ED Diagnoses Final  diagnoses:  Acute cystitis with hematuria  Type 2 diabetes mellitus without complication, without long-term current use of insulin (HCC)   I have reviewed the triage vital signs and the nursing notes. Pertinent labs & imaging results that were available during my care of the patient were reviewed by me and considered in my medical decision making (see chart for details). After history, exam, and medical workup I feel the patient has been appropriately medically screened and is safe for discharge home. Pertinent diagnoses were discussed with the patient. Patient was given return precautions.  Rx / DC Orders ED Discharge Orders          Ordered    cephALEXin (KEFLEX) 500 MG capsule  4 times daily        07/12/23 0240    naproxen (NAPROSYN) 500 MG tablet  2 times daily        07/12/23 0240    ondansetron (ZOFRAN-ODT) 8 MG disintegrating tablet        07/12/23 0240              Herron Fero, MD 07/12/23 1610

## 2023-07-12 NOTE — ED Triage Notes (Signed)
Pt is here for evaluation of left sided abdominal pain with radiation into left flank and pressure when urinating and frequent urination.  Pt reports that this began a few hours ago and has been associated with nausea and vomiting x4

## 2024-01-04 ENCOUNTER — Encounter (HOSPITAL_COMMUNITY): Payer: Self-pay

## 2024-01-04 ENCOUNTER — Emergency Department (HOSPITAL_COMMUNITY)

## 2024-01-04 ENCOUNTER — Emergency Department (HOSPITAL_COMMUNITY)
Admission: EM | Admit: 2024-01-04 | Discharge: 2024-01-05 | Disposition: A | Discharge status: Home / Self Care | Attending: Emergency Medicine | Admitting: Emergency Medicine

## 2024-01-04 DIAGNOSIS — Z79899 Other long term (current) drug therapy: Secondary | ICD-10-CM | POA: Insufficient documentation

## 2024-01-04 DIAGNOSIS — B3731 Acute candidiasis of vulva and vagina: Secondary | ICD-10-CM | POA: Diagnosis not present

## 2024-01-04 DIAGNOSIS — I10 Essential (primary) hypertension: Secondary | ICD-10-CM | POA: Insufficient documentation

## 2024-01-04 DIAGNOSIS — E876 Hypokalemia: Secondary | ICD-10-CM | POA: Insufficient documentation

## 2024-01-04 DIAGNOSIS — D649 Anemia, unspecified: Secondary | ICD-10-CM | POA: Insufficient documentation

## 2024-01-04 DIAGNOSIS — E119 Type 2 diabetes mellitus without complications: Secondary | ICD-10-CM | POA: Insufficient documentation

## 2024-01-04 DIAGNOSIS — N12 Tubulo-interstitial nephritis, not specified as acute or chronic: Secondary | ICD-10-CM | POA: Diagnosis not present

## 2024-01-04 DIAGNOSIS — R1032 Left lower quadrant pain: Secondary | ICD-10-CM | POA: Diagnosis present

## 2024-01-04 DIAGNOSIS — Z91148 Patient's other noncompliance with medication regimen for other reason: Secondary | ICD-10-CM | POA: Insufficient documentation

## 2024-01-04 HISTORY — DX: Essential (primary) hypertension: I10

## 2024-01-04 LAB — CBC WITH DIFFERENTIAL/PLATELET
Abs Immature Granulocytes: 0.07 K/uL (ref 0.00–0.07)
Basophils Absolute: 0 K/uL (ref 0.0–0.1)
Basophils Relative: 0 %
Eosinophils Absolute: 0.1 K/uL (ref 0.0–0.5)
Eosinophils Relative: 1 %
HCT: 38.7 % (ref 36.0–46.0)
Hemoglobin: 11.6 g/dL — ABNORMAL LOW (ref 12.0–15.0)
Immature Granulocytes: 1 %
Lymphocytes Relative: 19 %
Lymphs Abs: 2.2 K/uL (ref 0.7–4.0)
MCH: 23.5 pg — ABNORMAL LOW (ref 26.0–34.0)
MCHC: 30 g/dL (ref 30.0–36.0)
MCV: 78.5 fL — ABNORMAL LOW (ref 80.0–100.0)
Monocytes Absolute: 0.5 K/uL (ref 0.1–1.0)
Monocytes Relative: 4 %
Neutro Abs: 8.9 K/uL — ABNORMAL HIGH (ref 1.7–7.7)
Neutrophils Relative %: 75 %
Platelets: 405 K/uL — ABNORMAL HIGH (ref 150–400)
RBC: 4.93 MIL/uL (ref 3.87–5.11)
RDW: 16 % — ABNORMAL HIGH (ref 11.5–15.5)
WBC: 11.8 K/uL — ABNORMAL HIGH (ref 4.0–10.5)
nRBC: 0 % (ref 0.0–0.2)

## 2024-01-04 LAB — COMPREHENSIVE METABOLIC PANEL WITH GFR
ALT: 13 U/L (ref 0–44)
AST: 17 U/L (ref 15–41)
Albumin: 3.3 g/dL — ABNORMAL LOW (ref 3.5–5.0)
Alkaline Phosphatase: 70 U/L (ref 38–126)
Anion gap: 8 (ref 5–15)
BUN: 7 mg/dL (ref 6–20)
CO2: 27 mmol/L (ref 22–32)
Calcium: 8.8 mg/dL — ABNORMAL LOW (ref 8.9–10.3)
Chloride: 102 mmol/L (ref 98–111)
Creatinine, Ser: 0.66 mg/dL (ref 0.44–1.00)
GFR, Estimated: >60 mL/min (ref >60)
Glucose, Bld: 211 mg/dL — ABNORMAL HIGH (ref 70–99)
Potassium: 3.3 mmol/L — ABNORMAL LOW (ref 3.5–5.1)
Sodium: 137 mmol/L (ref 135–145)
Total Bilirubin: 0.5 mg/dL (ref 0.0–1.2)
Total Protein: 7.5 g/dL (ref 6.5–8.1)

## 2024-01-04 LAB — URINALYSIS, ROUTINE W REFLEX MICROSCOPIC
Bilirubin Urine: NEGATIVE
Glucose, UA: 50 mg/dL — AB
Ketones, ur: NEGATIVE mg/dL
Nitrite: POSITIVE — AB
Protein, ur: >=300 mg/dL — AB
Specific Gravity, Urine: 1.02 (ref 1.005–1.030)
WBC, UA: >50 WBC/hpf (ref 0–5)
pH: 6 (ref 5.0–8.0)

## 2024-01-04 NOTE — ED Notes (Signed)
 Post void visual-132 ml

## 2024-01-04 NOTE — ED Triage Notes (Signed)
 Patient arrived with complaints of left sided abdominal pain, reporting dark urine with an odor and feels likes she is not fully emptying her bladder. Given 650 mg tylenol with EMS

## 2024-01-05 ENCOUNTER — Encounter (HOSPITAL_COMMUNITY): Payer: Self-pay

## 2024-01-05 ENCOUNTER — Emergency Department (HOSPITAL_COMMUNITY)

## 2024-01-05 LAB — PREGNANCY, URINE: Preg Test, Ur: NEGATIVE

## 2024-01-05 MED ORDER — POTASSIUM CHLORIDE CRYS ER 20 MEQ PO TBCR
40.0000 meq | EXTENDED_RELEASE_TABLET | Freq: Once | ORAL | Status: AC
Start: 2024-01-05 — End: 2024-01-05
  Administered 2024-01-05: 40 meq via ORAL
  Filled 2024-01-05: qty 2

## 2024-01-05 MED ORDER — AMLODIPINE BESYLATE 10 MG PO TABS: 10.0000 mg | ORAL_TABLET | Freq: Every day | ORAL | 2 refills | Status: AC

## 2024-01-05 MED ORDER — SODIUM CHLORIDE 0.9 % IV SOLN
2.0000 g | Freq: Once | INTRAVENOUS | Status: AC
  Administered 2024-01-05: 2 g via INTRAVENOUS
  Filled 2024-01-05: qty 20

## 2024-01-05 MED ORDER — MORPHINE SULFATE (PF) 4 MG/ML IV SOLN
4.0000 mg | Freq: Once | INTRAVENOUS | Status: AC
  Administered 2024-01-05: 4 mg via INTRAVENOUS
  Filled 2024-01-05: qty 1

## 2024-01-05 MED ORDER — CEFPODOXIME PROXETIL 200 MG PO TABS: 200.0000 mg | ORAL_TABLET | Freq: Two times a day (BID) | ORAL | 0 refills | Status: DC

## 2024-01-05 MED ORDER — FLUCONAZOLE 150 MG PO TABS: 150.0000 mg | ORAL_TABLET | Freq: Every day | ORAL | 0 refills | Status: AC

## 2024-01-05 MED ORDER — ONDANSETRON HCL 4 MG/2ML IJ SOLN
4.0000 mg | Freq: Once | INTRAMUSCULAR | Status: AC
  Administered 2024-01-05: 4 mg via INTRAVENOUS
  Filled 2024-01-05: qty 2

## 2024-01-05 MED ORDER — AMLODIPINE BESYLATE 5 MG PO TABS
5.0000 mg | ORAL_TABLET | Freq: Once | ORAL | Status: DC
  Filled 2024-01-05: qty 1

## 2024-01-05 MED ORDER — AMLODIPINE BESYLATE 5 MG PO TABS
10.0000 mg | ORAL_TABLET | Freq: Once | ORAL | Status: AC
  Administered 2024-01-05: 10 mg via ORAL
  Filled 2024-01-05: qty 2

## 2024-01-05 MED ORDER — ONDANSETRON 4 MG PO TBDP: 4.0000 mg | ORAL_TABLET | Freq: Three times a day (TID) | ORAL | 0 refills | Status: DC | PRN

## 2024-01-05 MED ORDER — IOHEXOL 300 MG/ML  SOLN
100.0000 mL | Freq: Once | INTRAMUSCULAR | Status: AC | PRN
  Administered 2024-01-05: 100 mL via INTRAVENOUS

## 2024-01-05 NOTE — Discharge Instructions (Addendum)
 You were seen in the ER today for evaluation of your symptoms.  About that you are feeling better.  I have included more information in the discharge report for you to review on your diagnosis.  You will need to follow-up with your primary care provider to have your medications refilled as well as to recheck your potassium as it was low today.  It is extremely important that you remain compliant to your medications and take them as prescribed.  I have refilled a medication called amlodipine  which you can take daily for your blood pressure.  Additionally, you will need to be on an antibiotic for your urinary tract infection.  You need to take this twice a day for the next 10 days.  Please make sure that you are compliant with the medication and take as directed.  I have prescribed you Diflucan  that you will take today and in 72 hours take the second dose.  Additionally, have sent you in some Zofran  which can take as needed for nausea.  Please make sure that you are staying well-hydrated plenty of fluids, any water.  If you have any concerns for new or worsening symptoms, please return to your nearest emerged part for reevaluation.  Contact a doctor if: You do not feel better after 2 days. Your symptoms get worse. You have a fever or chills. You cannot take your medicine. Get help right away if: You vomit each time that you eat or drink. You have very bad pain in your back or side. You are very weak, or you faint.

## 2024-01-05 NOTE — ED Notes (Signed)
 Patient d.c with home care instructions. IV discontinued. Provided patient with a bus pass.

## 2024-01-05 NOTE — ED Provider Notes (Signed)
 [place] EMERGENCY DEPARTMENT AT [facility] Provider Note   CSN: [id number] Arrival date & time: 01/04/24  2109     Patient presents with: No chief complaint on file.   Heather Deleon is a 38 y.o. female with h/o HTN, DM presents to the ER today for evaluation of left lower quadrant pain for the past 3 days.  Described as achy in nature.  Does radiate some to the left lower back.  Is having some dysuria with urgency and frequency but no hematuria.  Is having some nausea with some vomiting tonight but no coffee-ground emesis or hematemesis.  Denies any vaginal bleeding or abnormal acute vaginal discharge.  Denies any fevers or diarrhea.  Reports that she does feel possibly with her last bowel movement being today but was hard and small.  Still passing gas.  Denies any chest pain, shortness of breath, headache, blurry vision.  Patient reports that she has not had her diabetes or blood pressure medication in months.  Does not know what her call but reports that she does have refills available at the pharmacy.  No known drug allergies.  Does smoke 1 pack/day.  Denies any EtOH or illicit drug use. Sees [facility] for PCP.   HPI     Prior to Admission medications   Medication Sig Start Date End Date Taking? Authorizing Provider  amLODipine  (NORVASC ) 10 MG tablet Take 1 tablet (10 mg total) by mouth daily. 01/05/24  Yes Bernis Ernst, PA-C  cefpodoxime  (VANTIN ) 200 MG tablet Take 1 tablet (200 mg total) by mouth 2 (two) times daily for 10 days. 01/05/24 01/15/24 Yes Bernis Ernst, PA-C  fluconazole  (DIFLUCAN ) 150 MG tablet Take 1 tablet (150 mg total) by mouth daily for 2 doses. Take 1 pill today. Take the other dose 72 hours later. 01/05/24 01/07/24 Yes Bernis Ernst, PA-C  ondansetron  (ZOFRAN -ODT) 4 MG disintegrating tablet Take 1 tablet (4 mg total) by mouth every 8 (eight) hours as needed for nausea or vomiting. 01/05/24  Yes Bernis Ernst, PA-C    Allergies: Percocet  [oxycodone-acetaminophen]    Review of Systems  Constitutional:  Negative for chills and fever.  Respiratory:  Negative for shortness of breath.   Cardiovascular:  Negative for chest pain.  Gastrointestinal:  Positive for abdominal pain, constipation, nausea and vomiting. Negative for diarrhea.       Denies any fecal incontinence  Genitourinary:  Positive for dysuria, frequency and urgency. Negative for hematuria, vaginal bleeding, vaginal discharge and vaginal pain.       Denies any urinary incontinence or urinary retention.  Musculoskeletal:  Positive for back pain.       Denies any saddle anesthesia  Neurological:  Negative for weakness and numbness.    Updated Vital Signs BP (!) 154/84 (BP Location: Left Arm)   Pulse 82   Temp 98.4 F (36.9 C) (Oral)   Resp 18   SpO2 99%   Physical Exam Vitals and nursing note reviewed.  Constitutional:      General: She is not in acute distress.    Appearance: She is not ill-appearing or toxic-appearing.  HENT:     Mouth/Throat:     Mouth: Mucous membranes are moist.  Eyes:     General: No scleral icterus. Cardiovascular:     Rate and Rhythm: Normal rate.  Pulmonary:     Effort: Pulmonary effort is normal. No respiratory distress.  Abdominal:     Palpations: Abdomen is soft.     Tenderness: There is no  abdominal tenderness. There is no right CVA tenderness, left CVA tenderness, guarding or rebound.     Comments: Abdomen soft and nontender.  Skin:    General: Skin is warm and dry.  Neurological:     Mental Status: She is alert.     (all labs ordered are listed, but only abnormal results are displayed) Labs Reviewed  URINALYSIS, ROUTINE W REFLEX MICROSCOPIC - Abnormal; Notable for the following components:      Result Value   APPearance CLOUDY (*)    Glucose, UA 50 (*)    Hgb urine dipstick SMALL (*)    Protein, ur >=300 (*)    Nitrite POSITIVE (*)    Leukocytes,Ua MODERATE (*)    Bacteria, UA MANY (*)    All other  components within normal limits  CBC WITH DIFFERENTIAL/PLATELET - Abnormal; Notable for the following components:   WBC 11.8 (*)    Hemoglobin 11.6 (*)    MCV 78.5 (*)    MCH 23.5 (*)    RDW 16.0 (*)    Platelets 405 (*)    Neutro Abs 8.9 (*)    All other components within normal limits  COMPREHENSIVE METABOLIC PANEL WITH GFR - Abnormal; Notable for the following components:   Potassium 3.3 (*)    Glucose, Bld 211 (*)    Calcium 8.8 (*)    Albumin 3.3 (*)    All other components within normal limits  URINE CULTURE  PREGNANCY, URINE    EKG: None  Radiology: CT ABDOMEN PELVIS W CONTRAST Result Date: 01/05/2024 EXAM: CT ABDOMEN AND PELVIS WITH CONTRAST 01/05/2024 02:08:10 AM TECHNIQUE: CT of the abdomen and pelvis was performed with the administration of intravenous contrast. Multiplanar reformatted images are provided for review. Automated exposure control, iterative reconstruction, and/or weight based adjustment of the mA/kV was utilized to reduce the radiation dose to as low as reasonably achievable. COMPARISON: 07/12/2023 CLINICAL HISTORY: Abdominal/flank pain, stone suspected; LLQ abdominal pain. Left sided abdominal pain, reporting dark urine with an odor and feels likes she is not fully emptying her bladder, wbc's 11.8, GFR>60, rbc's and wbc's in urine, negative urine pregnancy test 01/05/24, hx of HTN and C-section. FINDINGS: LOWER CHEST: No acute abnormality. LIVER: The liver is unremarkable. GALLBLADDER AND BILE DUCTS: Gallbladder is unremarkable. No biliary ductal dilatation. SPLEEN: No acute abnormality. PANCREAS: No acute abnormality. ADRENAL GLANDS: No acute abnormality. KIDNEYS, URETERS AND BLADDER: No stones in the kidneys or ureters. No hydronephrosis. Urinary bladder is unremarkable. Mild urothelial thickening/enhancement in the left proximal collecting system/ureter, favoring ascending infection/pyonephrosis. GI AND BOWEL: Stomach demonstrates no acute abnormality. There is no  bowel obstruction. No bowel wall thickening. Normal appendix (image 52). PERITONEUM AND RETROPERITONEUM: No ascites. No free air. VASCULATURE: Aorta is normal in caliber. LYMPH NODES: No lymphadenopathy. REPRODUCTIVE ORGANS: Uterine fibroids. BONES AND SOFT TISSUES: Postsurgical changes along the lower anterior abdominal wall. No acute osseous abnormality. No focal soft tissue abnormality. IMPRESSION: 1. Mild urothelial thickening/enhancement in the left proximal collecting system/ureter, favoring ascending infection/pyonephrosis. Electronically signed by: Pinkie Pebbles MD 01/05/2024 02:17 AM EDT RP Workstation: [id number]    Procedures   Medications Ordered in the ED  amLODipine  (NORVASC ) tablet 5 mg (5 mg Oral Not Given 01/05/24 0315)  iohexol  (OMNIPAQUE ) 300 MG/ML solution 100 mL (100 mLs Intravenous Contrast Given 01/05/24 0152)  morphine  (PF) 4 MG/ML injection 4 mg (4 mg Intravenous Given 01/05/24 0119)  ondansetron  (ZOFRAN ) injection 4 mg (4 mg Intravenous Given 01/05/24 0117)  cefTRIAXone  (ROCEPHIN ) 2 g in  sodium chloride  0.9 % 100 mL IVPB (0 g Intravenous Stopped 01/05/24 0327)  amLODipine  (NORVASC ) tablet 10 mg (10 mg Oral Given 01/05/24 0257)  potassium chloride  SA (KLOR-CON  M) CR tablet 40 mEq (40 mEq Oral Given 01/05/24 0500)    Clinical Course as of 01/05/24 0534  Tue Jan 05, 2024  0234 Last called [facility], no fills of prescriptions since 2023. [RR]    Clinical Course User Index [RR] Bernis Ernst, PA-C   Medical Decision Making Amount and/or Complexity of Data Reviewed Labs: ordered. Radiology: ordered.  Risk Prescription drug management.   38 y.o. female presents to the ER for evaluation of LUQ pain with dysuria. Differential diagnosis includes but is not limited to AAA, mesenteric ischemia, appendicitis, diverticulitis, DKA, gastroenteritis, nephrolithiasis, pancreatitis, constipation, UTI, bowel obstruction, biliary disease, IBD, PUD, hepatitis, ectopic pregnancy,  ovarian torsion, PID. Vital signs elevated blood pressure otherwise unremarkable. Physical exam as noted above.   Patient does not appear in any acute distress.  Not actively vomiting.  Ordered some fluids and antiemetics.  Will obtain labs and CT imaging.  Likely UTI given patient reports this feels like her previous one.  I independently reviewed and interpreted the patient's labs.  CMP shows mild hypokalemia at 3.3.  Glucose at 211.  Calcium 8.8 with an albumin of 3.3.  Otherwise no electrolyte or LFT abnormality.  CBC shows mild acidosis 11.8 with a left shift.  Mild anemia with a hemoglobin 1.6.  Platelets of 405.  Could be some hemoconcentration, not far off patient's baseline.  Urinalysis shows cloudy urine with 50 glucose present.  Small hemoglobin with greater than 300 protein and positive nitrites and moderate leukocytes.  21-50 red blood cells seen with greater than 50 white blood cells with many bacteria.  There is some squamous epithelial present however there is white blood cell clumps.  Given patient's symptoms likely congruent with UTI.  Pregnancy test is negative.  CT 1. Mild urothelial thickening/enhancement in the left proximal collecting system/ureter, favoring ascending infection/pyonephrosis. Per radiologist's interpretation.    Patient does not meet any SIRS criteria.  White count less than 12, not tachycardic, not febrile.  After medication, she reports that she feels back to baseline of health and is feeling much better.  Has eaten multiple food and drink here without any issue.  I have cultured urine however do not see any previous culture for what her urine has grown out.  I have given her 2 g of Rocephin  here given that she is over 100 kg.  Will send her home with cefpodoxime  for 10 days.  Have also sent her in send yeast given she reports it gives her yeast infections.  Have also sent her in some Zofran  to take as needed for nausea.  Unsure which blood pressure medication she is  on even after pharmacy doctor reconcile medications.  Will give her 10 mg of amlodipine .  Patient reports that this does sound like the medication she was on previously.  Nursing ordered another 5 mg dose given that she dropped one of the pills.  Patient only received 10 mg of amlodipine .  Patient blood pressure has improved and is now 154/84.  Otherwise unremarkable still.  Given that she is hemodynamically stable and is feeling much better, do feel she is stable for discharge home with close outpatient follow-up.  Recommended strict medication adherence was follow with PCP.    We discussed the results of the labs/imaging. The plan is take medications as prescribed, follow up with  PCP. We discussed strict return precautions and red flag symptoms. The patient verbalized their understanding and agrees to the plan. The patient is stable and being discharged home in good condition.  Portions of this report may have been transcribed using voice recognition software. Every effort was made to ensure accuracy; however, inadvertent computerized transcription errors may be present.    Final diagnoses:  Non compliance w medication regimen  Uncontrolled hypertension  Pyelonephritis  Hypokalemia  Yeast vaginitis    ED Discharge Orders          Ordered    amLODipine  (NORVASC ) 10 MG tablet  Daily        01/05/24 0443    cefpodoxime  (VANTIN ) 200 MG tablet  2 times daily        01/05/24 0443    ondansetron  (ZOFRAN -ODT) 4 MG disintegrating tablet  Every 8 hours PRN        01/05/24 0443    fluconazole  (DIFLUCAN ) 150 MG tablet  Daily        01/05/24 0448               Bernis Ernst, PA-C 01/05/24 0539    Melvenia Motto, MD 01/05/24 [phone]

## 2024-01-07 LAB — URINE CULTURE: Culture: >=100000 — AB

## 2024-01-08 ENCOUNTER — Telehealth (HOSPITAL_BASED_OUTPATIENT_CLINIC_OR_DEPARTMENT_OTHER): Payer: Self-pay | Admitting: *Deleted

## 2024-01-08 ENCOUNTER — Telehealth (HOSPITAL_COMMUNITY): Payer: Self-pay

## 2024-01-08 MED ORDER — CEPHALEXIN 500 MG PO CAPS: 500.0000 mg | ORAL_CAPSULE | Freq: Two times a day (BID) | ORAL | 0 refills | Status: DC

## 2024-01-08 NOTE — Telephone Encounter (Signed)
 Post ED Visit - Positive Culture Follow-up  Culture report reviewed by antimicrobial stewardship pharmacist: [facility] []  Rankin Dee, Pharm.D. []  Venetia Gully, Pharm.D., BCPS AQ-ID []  Garrel Crews, Pharm.D., BCPS []  Almarie Lunger, Pharm.D., BCPS []  [place], [address].D., BCPS, AAHIVP []  Rosaline Bihari, Pharm.D., BCPS, AAHIVP []  Vernell Meier, PharmD, BCPS []  Latanya Hint, PharmD, BCPS []  Donald Medley, PharmD, BCPS []  Rocky Bold, PharmD []  Dorothyann Alert, PharmD, BCPS []  Morene Babe, PharmD  [facility] [x]  Wanda Hasting, PharmD []  Romona Bliss, PharmD []  Dolphus Roller, PharmD []  Veva Seip, Rph []  Vernell Daunt) Leonce, PharmD []  Eva Allis, PharmD []  Rosaline Millet, PharmD []  Iantha Batch, PharmD []  Arvin Gauss, PharmD []  Wanda Hasting, PharmD []  Ronal Rav, PharmD []  Rocky Slade, PharmD []  Bard Jeans, PharmD   Positive urine culture Treated with Cefpodoxime  Proxetil and Fluconazole , organism sensitive to the same and no further patient follow-up is required at this time.  Heather Deleon 01/08/2024, 11:36 AM

## 2024-01-08 NOTE — ED Notes (Signed)
 01/08/24 1200 pt called to see if the antibiotic prescription could be changed due to cost and insurance would not approve. Spoke with [facility] PA who has sent Keflex  order to pharmacy. Pt made aware and will pick up medication.

## 2024-01-08 NOTE — Telephone Encounter (Cosign Needed)
Med changed per pt. request

## 2024-07-04 ENCOUNTER — Emergency Department (HOSPITAL_COMMUNITY)

## 2024-07-04 ENCOUNTER — Emergency Department (HOSPITAL_COMMUNITY)
Admission: EM | Admit: 2024-07-04 | Discharge: 2024-07-04 | Disposition: A | Discharge status: Home / Self Care | Attending: Emergency Medicine | Admitting: Emergency Medicine

## 2024-07-04 ENCOUNTER — Encounter (HOSPITAL_COMMUNITY): Payer: Self-pay

## 2024-07-04 ENCOUNTER — Other Ambulatory Visit: Payer: Self-pay

## 2024-07-04 DIAGNOSIS — I10 Essential (primary) hypertension: Secondary | ICD-10-CM | POA: Diagnosis not present

## 2024-07-04 DIAGNOSIS — N390 Urinary tract infection, site not specified: Secondary | ICD-10-CM | POA: Insufficient documentation

## 2024-07-04 DIAGNOSIS — N12 Tubulo-interstitial nephritis, not specified as acute or chronic: Secondary | ICD-10-CM

## 2024-07-04 DIAGNOSIS — R030 Elevated blood-pressure reading, without diagnosis of hypertension: Secondary | ICD-10-CM

## 2024-07-04 DIAGNOSIS — R350 Frequency of micturition: Secondary | ICD-10-CM | POA: Diagnosis present

## 2024-07-04 DIAGNOSIS — Z79899 Other long term (current) drug therapy: Secondary | ICD-10-CM | POA: Diagnosis not present

## 2024-07-04 LAB — URINALYSIS, ROUTINE W REFLEX MICROSCOPIC
Bilirubin Urine: NEGATIVE
Glucose, UA: NEGATIVE mg/dL
Ketones, ur: NEGATIVE mg/dL
Nitrite: POSITIVE — AB
Protein, ur: >=300 mg/dL — AB
Specific Gravity, Urine: 1.015 (ref 1.005–1.030)
WBC, UA: >50 WBC/hpf (ref 0–5)
pH: 6 (ref 5.0–8.0)

## 2024-07-04 LAB — CBC
HCT: 33.9 % — ABNORMAL LOW (ref 36.0–46.0)
Hemoglobin: 10.6 g/dL — ABNORMAL LOW (ref 12.0–15.0)
MCH: 24.3 pg — ABNORMAL LOW (ref 26.0–34.0)
MCHC: 31.3 g/dL (ref 30.0–36.0)
MCV: 77.8 fL — ABNORMAL LOW (ref 80.0–100.0)
Platelets: 351 K/uL (ref 150–400)
RBC: 4.36 MIL/uL (ref 3.87–5.11)
RDW: 16.4 % — ABNORMAL HIGH (ref 11.5–15.5)
WBC: 11 K/uL — ABNORMAL HIGH (ref 4.0–10.5)
nRBC: 0 % (ref 0.0–0.2)

## 2024-07-04 LAB — BASIC METABOLIC PANEL WITH GFR
Anion gap: 10 (ref 5–15)
BUN: 8 mg/dL (ref 6–20)
CO2: 25 mmol/L (ref 22–32)
Calcium: 9.1 mg/dL (ref 8.9–10.3)
Chloride: 102 mmol/L (ref 98–111)
Creatinine, Ser: 0.72 mg/dL (ref 0.44–1.00)
GFR, Estimated: >60 mL/min
Glucose, Bld: 160 mg/dL — ABNORMAL HIGH (ref 70–99)
Potassium: 3.5 mmol/L (ref 3.5–5.1)
Sodium: 137 mmol/L (ref 135–145)

## 2024-07-04 LAB — TROPONIN T, HIGH SENSITIVITY: Troponin T High Sensitivity: <15 ng/L (ref 0–19)

## 2024-07-04 LAB — PREGNANCY, URINE: Preg Test, Ur: NEGATIVE

## 2024-07-04 MED ORDER — SODIUM CHLORIDE 0.9 % IV BOLUS
500.0000 mL | Freq: Once | INTRAVENOUS | Status: AC
  Administered 2024-07-04: 500 mL via INTRAVENOUS

## 2024-07-04 MED ORDER — IOHEXOL 300 MG/ML  SOLN
100.0000 mL | Freq: Once | INTRAMUSCULAR | Status: AC | PRN
  Administered 2024-07-04: 100 mL via INTRAVENOUS

## 2024-07-04 MED ORDER — ONDANSETRON HCL 4 MG/2ML IJ SOLN
4.0000 mg | Freq: Once | INTRAMUSCULAR | Status: AC
  Administered 2024-07-04: 4 mg via INTRAVENOUS
  Filled 2024-07-04: qty 2

## 2024-07-04 MED ORDER — KETOROLAC TROMETHAMINE 15 MG/ML IJ SOLN
15.0000 mg | Freq: Once | INTRAMUSCULAR | Status: AC
  Administered 2024-07-04: 15 mg via INTRAVENOUS
  Filled 2024-07-04: qty 1

## 2024-07-04 MED ORDER — HYDRALAZINE HCL 20 MG/ML IJ SOLN
10.0000 mg | Freq: Once | INTRAMUSCULAR | Status: AC
  Administered 2024-07-04: 10 mg via INTRAVENOUS
  Filled 2024-07-04: qty 1

## 2024-07-04 MED ORDER — ONDANSETRON 4 MG PO TBDP: 4.0000 mg | ORAL_TABLET | Freq: Four times a day (QID) | ORAL | 0 refills | Status: AC | PRN

## 2024-07-04 MED ORDER — CEPHALEXIN 500 MG PO CAPS: 500.0000 mg | ORAL_CAPSULE | Freq: Four times a day (QID) | ORAL | 0 refills | Status: AC

## 2024-07-04 MED ORDER — ONDANSETRON 4 MG PO TBDP: 4.0000 mg | ORAL_TABLET | Freq: Once | ORAL | Status: DC

## 2024-07-04 MED ORDER — SODIUM CHLORIDE 0.9 % IV SOLN
1.0000 g | Freq: Once | INTRAVENOUS | Status: AC
  Administered 2024-07-04: 1 g via INTRAVENOUS
  Filled 2024-07-04: qty 10

## 2024-07-04 MED ORDER — IBUPROFEN 200 MG PO TABS
600.0000 mg | ORAL_TABLET | Freq: Once | ORAL | Status: AC
  Administered 2024-07-04: 600 mg via ORAL
  Filled 2024-07-04: qty 3

## 2024-07-04 NOTE — ED Provider Notes (Signed)
 "  EMERGENCY DEPARTMENT AT [facility] Provider Note   CSN: [id number] Arrival date & time: 07/04/24  0206     Patient presents with: Urinary Frequency   Heather Deleon is a 39 y.o. female.  Patient with past medical history significant for hypertension presents to the emergency room via EMS complaining of 2 days of urinary frequency and dysuria.  She endorses some mild associated nausea with no vomiting.  She states she has had a history of urinary tract infections but due to difficulty with medications and affordability has never completed a full treatment.  Her last diagnosis of a urinary tract infection was in July of this year. After discussing the workup for her urinary complaint the patient stated that she has also been having chest pain.  She states she has been having chest pain for the past month described as a pressure.  She has had issues with affordability of her blood pressure medication and believes that her blood pressure may be driving her chest discomfort.  She states that she has a new PCP that she does not feel as comfortable with is her previous PCP and has not been following up as recommended.  She denies shortness of breath with the chest pain.  Has been ongoing for 1 month.  There are no aggravating or alleviating factors.    Urinary Frequency       Prior to Admission medications  Medication Sig Start Date End Date Taking? Authorizing Provider  amLODipine  (NORVASC ) 10 MG tablet Take 1 tablet (10 mg total) by mouth daily. 01/05/24   Bernis Ernst, PA-C  cephALEXin  (KEFLEX ) 500 MG capsule Take 1 capsule (500 mg total) by mouth 2 (two) times daily. 01/08/24   [place], Collin F, PA-C  ondansetron  (ZOFRAN -ODT) 4 MG disintegrating tablet Take 1 tablet (4 mg total) by mouth every 8 (eight) hours as needed for nausea or vomiting. 01/05/24   Bernis Ernst, PA-C    Allergies: Percocet [oxycodone-acetaminophen]    Review of Systems  Genitourinary:  Positive  for frequency.    Updated Vital Signs BP (!) 194/100   Pulse 64   Temp 98.3 F (36.8 C) (Oral)   Resp 18   SpO2 98%   Physical Exam Vitals and nursing note reviewed.  Constitutional:      General: She is not in acute distress.    Appearance: She is well-developed. She is obese.  HENT:     Head: Normocephalic and atraumatic.  Eyes:     Conjunctiva/sclera: Conjunctivae normal.  Cardiovascular:     Rate and Rhythm: Normal rate and regular rhythm.     Heart sounds: No murmur heard. Pulmonary:     Effort: Pulmonary effort is normal. No respiratory distress.     Breath sounds: Normal breath sounds.  Abdominal:     Palpations: Abdomen is soft.     Tenderness: There is no abdominal tenderness.  Musculoskeletal:        General: No swelling.     Cervical back: Neck supple.     Right lower leg: No edema.     Left lower leg: No edema.  Skin:    General: Skin is warm and dry.     Capillary Refill: Capillary refill takes less than 2 seconds.  Neurological:     Mental Status: She is alert.  Psychiatric:        Mood and Affect: Mood normal.     (all labs ordered are listed, but only abnormal results are displayed) Labs  Reviewed  URINALYSIS, ROUTINE W REFLEX MICROSCOPIC - Abnormal; Notable for the following components:      Result Value   APPearance CLOUDY (*)    Hgb urine dipstick SMALL (*)    Protein, ur >=300 (*)    Nitrite POSITIVE (*)    Leukocytes,Ua LARGE (*)    Bacteria, UA RARE (*)    All other components within normal limits  BASIC METABOLIC PANEL WITH GFR - Abnormal; Notable for the following components:   Glucose, Bld 160 (*)    All other components within normal limits  CBC - Abnormal; Notable for the following components:   WBC 11.0 (*)    Hemoglobin 10.6 (*)    HCT 33.9 (*)    MCV 77.8 (*)    MCH 24.3 (*)    RDW 16.4 (*)    All other components within normal limits  PREGNANCY, URINE  TROPONIN T, HIGH SENSITIVITY    EKG: None  Radiology: DG Chest  1 View Result Date: 07/04/2024 EXAM: 1 VIEW(S) XRAY OF THE CHEST 07/04/2024 03:32:01 AM COMPARISON: None available. CLINICAL HISTORY: Chest pain FINDINGS: LUNGS AND PLEURA: Mild vascular congestion is noted without significant edema. No pleural effusion. No pneumothorax. HEART AND MEDIASTINUM: Cardiomegaly. BONES AND SOFT TISSUES: No acute osseous abnormality. IMPRESSION: 1. Mild vascular congestion without significant edema. 2. Cardiomegaly. Electronically signed by: Oneil Devonshire MD 07/04/2024 03:37 AM EST RP Workstation: [id number]     Procedures   Medications Ordered in the ED  ondansetron  (ZOFRAN ) injection 4 mg (4 mg Intravenous Given 07/04/24 0350)  sodium chloride  0.9 % bolus 500 mL (0 mLs Intravenous Stopped 07/04/24 0544)  ibuprofen  (ADVIL ) tablet 600 mg (600 mg Oral Given 07/04/24 0432)  iohexol  (OMNIPAQUE ) 300 MG/ML solution 100 mL (100 mLs Intravenous Contrast Given 07/04/24 0631)                                    Medical Decision Making Amount and/or Complexity of Data Reviewed Labs: ordered. Radiology: ordered.  Risk OTC drugs. Prescription drug management.   This patient presents to the ED for concern of dysuria/urinary frequency, this involves an extensive number of treatment options, and is a complaint that carries with it a high risk of complications and morbidity.  The differential diagnosis includes cystitis, pyelonephritis, others  Differential for the chest pain includes musculoskeletal pain, anxiety, hypertensive urgency, ACS, pneumonia, others   Co morbidities / Chronic conditions that complicate the patient evaluation  Hypertension   Additional history obtained:  Additional history obtained from EMR   Lab Tests:  I Ordered, and personally interpreted labs.  The pertinent results include: UA is nitrite positive with large leukocytes, greater than 50 WBCs, rare bacteria   Imaging Studies ordered:  I ordered imaging studies including chest x-ray, ct  abdomen pelvis I independently visualized and interpreted imaging which showed  1. Mild vascular congestion without significant edema.  2. Cardiomegaly.  CT pending I agree with the radiologist interpretation   Cardiac Monitoring: / EKG:  The patient was maintained on a cardiac monitor.  I personally viewed and interpreted the cardiac monitored which showed an underlying rhythm of: normal sinus rhythm   Problem List / ED Course / Critical interventions / Medication management   I ordered medication including saline bolus, zofran , advil    Reevaluation of the patient after these medicines showed that the patient improved I have reviewed the patients home medicines and have made  adjustments as needed   Social Determinants of Health:  Patient has Medicaid for her primary health insurance type   Test / Admission - Considered:  Patient care signed out to [employer], PA-C. Patient with evidence of UTI. Patient disposition pending results of CT scan.       Final diagnoses:  None    ED Discharge Orders     None          Logan Ubaldo KATHEE DEVONNA 07/04/24 9367    Theadore Ozell HERO, MD 07/04/24 [phone]  "

## 2024-07-04 NOTE — ED Triage Notes (Addendum)
 Pt BIB EMS from home with reports of dysuria and urinary frequency x 2 days. Pt has also had some n/v.   Pt is having issues with obtaining her medications.

## 2024-07-04 NOTE — Discharge Instructions (Addendum)
 Please take the entire course of antibiotics that I have prescribed, follow-up closely with your primary care doctor.  Please return to the emergency department if you have significant worsening pain with urination despite taking the antibiotics as prescribed.  It may take 2 to 3 days for the antibiotics to take effect but you did receive a dose in the emergency department which should help to tolerate the antibiotic process.

## 2024-07-04 NOTE — ED Provider Notes (Signed)
 Accepted handoff at shift change from [facility]. Please see prior provider note for more detail.   Briefly: Patient is 39 y.o.   DDX: concern for UTI versus pyelo versus other.  Plan: Independently interpreted CT abdomen pelvis with contrast which shows  1. Very subtle decreased perfusion to the left kidney with fullness  of the left intrarenal collecting system and ureter. Probable left  periureteric edema. No evidence for urinary stone disease. Imaging  features could be related to recently passed stone or urinary tract  infection. Given patient age, urothelial neoplasm considered less  likely.  2. Uterine fibroids.  3. 2 cm soft tissue nodule in the subcutaneous fat of the midline  low anterior abdominal wall is similar to prior. Finding is  indeterminate but interval stability is reassuring.   Blood pressure improved after one-time dose of hydralazine , patient has not taken her home blood pressure medication, 217/95 to 179/86.  UA, CT consistent with pyelonephritis, 1 dose Rocephin  given, discharged with Keflex .  Stable for discharge at this time.   Rosan Sherlean DEL, PA-C 07/04/24 0827    Theadore Ozell HERO, MD 07/05/24 [phone]
